# Patient Record
Sex: Female | Born: 1985 | Race: White | Hispanic: No | Marital: Single | Smoking: Current every day smoker
Health system: Southern US, Community
[De-identification: ages and names within clinical notes are randomized; demographics above are authoritative.]

---
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
## 2008-09-28 ENCOUNTER — Ambulatory Visit: Payer: Self-pay | Admitting: Internal Medicine

## 2008-09-30 ENCOUNTER — Ambulatory Visit: Payer: Self-pay | Admitting: Internal Medicine

## 2010-11-18 ENCOUNTER — Ambulatory Visit: Payer: Self-pay | Admitting: Otolaryngology

## 2016-01-17 ENCOUNTER — Encounter: Payer: Self-pay | Admitting: Obstetrics and Gynecology

## 2020-08-13 ENCOUNTER — Emergency Department
Admission: EM | Admit: 2020-08-13 | Discharge: 2020-08-13 | Disposition: A | Discharge status: Home / Self Care | Payer: No Typology Code available for payment source | Attending: Emergency Medicine | Admitting: Emergency Medicine

## 2020-08-13 ENCOUNTER — Encounter: Payer: Self-pay | Admitting: Emergency Medicine

## 2020-08-13 ENCOUNTER — Other Ambulatory Visit: Payer: Self-pay

## 2020-08-13 DIAGNOSIS — W540XXA Bitten by dog, initial encounter: Secondary | ICD-10-CM | POA: Diagnosis not present

## 2020-08-13 DIAGNOSIS — F17200 Nicotine dependence, unspecified, uncomplicated: Secondary | ICD-10-CM | POA: Insufficient documentation

## 2020-08-13 DIAGNOSIS — S61452A Open bite of left hand, initial encounter: Secondary | ICD-10-CM | POA: Diagnosis not present

## 2020-08-13 DIAGNOSIS — Z203 Contact with and (suspected) exposure to rabies: Secondary | ICD-10-CM | POA: Insufficient documentation

## 2020-08-13 DIAGNOSIS — Z23 Encounter for immunization: Secondary | ICD-10-CM | POA: Insufficient documentation

## 2020-08-13 DIAGNOSIS — Y990 Civilian activity done for income or pay: Secondary | ICD-10-CM | POA: Diagnosis not present

## 2020-08-13 DIAGNOSIS — Z2914 Encounter for prophylactic rabies immune globin: Secondary | ICD-10-CM | POA: Insufficient documentation

## 2020-08-13 DIAGNOSIS — S6992XA Unspecified injury of left wrist, hand and finger(s), initial encounter: Secondary | ICD-10-CM | POA: Diagnosis present

## 2020-08-13 MED ORDER — RABIES VACCINE, PCEC IM SUSR
1.0000 mL | Freq: Once | INTRAMUSCULAR | Status: AC
  Administered 2020-08-13: 1 mL via INTRAMUSCULAR
  Filled 2020-08-13: qty 1

## 2020-08-13 MED ORDER — RABIES IMMUNE GLOBULIN 150 UNIT/ML IM INJ
20.0000 [IU]/kg | INJECTION | Freq: Once | INTRAMUSCULAR | Status: AC
  Administered 2020-08-13: 1875 [IU] via INTRAMUSCULAR
  Filled 2020-08-13: qty 12.5

## 2020-08-13 NOTE — ED Provider Notes (Signed)
[facility] Emergency Department Provider Note  ____________________________________________  Time seen: Approximately 6:56 PM  I have reviewed the triage vital signs and the nursing notes.   HISTORY  Chief Complaint Animal Bite    HPI Rebecca Simpson is a 35 y.o. female who presents the emergency department status post dog bite to the left hand.  Patient has a very superficial bite to the left hand from an animal at work.  Patient has reported that the animal has expired rabies vaccine by 2 years.  No other complaints at this time.  Tetanus is up-to-date.         History reviewed. No pertinent past medical history.  There are no problems to display for this patient.   History reviewed. No pertinent surgical history.  Prior to Admission medications   Medication Sig Start Date End Date Taking? Authorizing Provider  busPIRone (BUSPAR) 5 MG tablet Take 5 mg by mouth 3 (three) times daily.   Yes [provider]  clonazePAM (KLONOPIN) 0.5 MG tablet Take 0.5 mg by mouth 2 (two) times daily as needed for anxiety.   Yes [provider]  lisinopril-hydrochlorothiazide (ZESTORETIC) 10-12.5 MG tablet Take 1 tablet by mouth daily.   Yes [provider]  venlafaxine XR (EFFEXOR-XR) 150 MG 24 hr capsule Take 150 mg by mouth daily with breakfast.   Yes [provider]    Allergies Patient has no known allergies.  No family history on file.  Social History Social History   Tobacco Use  . Smoking status: Current Every Day Smoker  . Smokeless tobacco: Never Used     Review of Systems  Constitutional: No fever/chills Eyes: No visual changes. No discharge ENT: No upper respiratory complaints. Cardiovascular: no chest pain. Respiratory: no cough. No SOB. Gastrointestinal: No abdominal pain.  No nausea, no vomiting.  No diarrhea.  No constipation. Musculoskeletal: Dog bite to left hand Skin: Negative for rash, abrasions,  lacerations, ecchymosis. Neurological: Negative for headaches, focal weakness or numbness.  10 System ROS otherwise negative.  ____________________________________________   PHYSICAL EXAM:  VITAL SIGNS: ED Triage Vitals  Enc Vitals Group     BP 08/13/20 1632 (!) 146/100     Pulse Rate 08/13/20 1632 (!) 122     Resp 08/13/20 1632 20     Temp 08/13/20 1632 98.1 F (36.7 C)     Temp src --      SpO2 08/13/20 1632 100 %     Weight 08/13/20 1630 205 lb (93 kg)     Height 08/13/20 1630 5\' 6"  (1.676 m)     Head Circumference --      Peak Flow --      Pain Score 08/13/20 1630 0     Pain Loc --      Pain Edu? --      Excl. in GC? --      Constitutional: Alert and oriented. Well appearing and in no acute distress. Eyes: Conjunctivae are normal. PERRL. EOMI. Head: Atraumatic. ENT:      Ears:       Nose: No congestion/rhinnorhea.      Mouth/Throat: Mucous membranes are moist.  Neck: No stridor.    Cardiovascular: Normal rate, regular rhythm. Normal S1 and S2.  Good peripheral circulation. Respiratory: Normal respiratory effort without tachypnea or retractions. Lungs CTAB. Good air entry to the bases with no decreased or absent breath sounds. Musculoskeletal: Full range of motion to all extremities. No gross deformities appreciated. Neurologic:  Normal  speech and language. No gross focal neurologic deficits are appreciated.  Skin:  Skin is warm, dry and intact. No rash noted. Psychiatric: Mood and affect are normal. Speech and behavior are normal. Patient exhibits appropriate insight and judgement.   ____________________________________________   LABS (all labs ordered are listed, but only abnormal results are displayed)  Labs Reviewed - No data to display ____________________________________________  EKG   ____________________________________________  RADIOLOGY   No results found.  ____________________________________________    PROCEDURES  Procedure(s)  performed:    Procedures    Medications  rabies vaccine (RABAVERT) injection 1 mL (1 mL Intramuscular Given 08/13/20 1831)  rabies immune globulin (HYPERAB/KEDRAB) injection 1,875 Units (1,875 Units Intramuscular Given 08/13/20 1830)     ____________________________________________   INITIAL IMPRESSION / ASSESSMENT AND PLAN / ED COURSE  Pertinent labs & imaging results that were available during my care of the patient were reviewed by me and considered in my medical decision making (see chart for details).  Review of the [place] CSRS was performed in accordance of the [facility] prior to dispensing any controlled drugs.           Patient's diagnosis is consistent with dog bite, need for rabies prophylaxis.  Patient presented to the emergency department after being bit by a dog while at work.  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  Relatively superficial injury requiring no treatment to the left hand.  Antibiotics prophylactically.  Patient will have rabies series as the animal is not up-to-date on rabies vaccine.  This is started today, return in 3 days for second rabies vaccine..  Concerning signs symptoms to return to the emergency department or follow-up sooner are discussed with the patient.  Patient is given ED precautions to return to the ED for any worsening or new symptoms.     ____________________________________________  FINAL CLINICAL IMPRESSION(S) / ED DIAGNOSES  Final diagnoses:  Dog bite, initial encounter  Need for post exposure prophylaxis for rabies      NEW MEDICATIONS STARTED DURING THIS VISIT:  ED Discharge Orders    None          This chart was dictated using voice recognition software/Dragon. Despite best efforts to proofread, errors can occur which can change the meaning. Any change was purely unintentional.    Racheal Patches, PA-C 08/13/20 Gaye Pollack, MD 08/13/20 [phone]

## 2020-08-13 NOTE — Discharge Instructions (Signed)
You may return to the emergency department in 3 days for utilize urgent care or primary care for your next rabies vaccine.  Schedule be as follows, return in 3 days, 7 days, 14 days from now for your second, third and fourth rabies vaccine.  You will not have the immunoglobin shots, so there is only 1 vaccine shot at each encounter.  If you miss your timeframe by more than 24 hours we will have to restart in the beginning.

## 2020-08-13 NOTE — ED Notes (Signed)
See triage note  Presents s/p dog bite  Has small puncture area to left hand    States the owner informed them that the dog was 2 years behind on vaccines  Pt works  at [employer]  office

## 2020-08-13 NOTE — ED Triage Notes (Addendum)
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  Patient very anxious.  States has fear of shots.  Very  Nervous about rabies injections.

## 2020-08-16 ENCOUNTER — Other Ambulatory Visit: Payer: Self-pay

## 2020-08-16 ENCOUNTER — Emergency Department
Admission: EM | Admit: 2020-08-16 | Discharge: 2020-08-16 | Disposition: A | Discharge status: Home / Self Care | Payer: No Typology Code available for payment source | Attending: Emergency Medicine | Admitting: Emergency Medicine

## 2020-08-16 ENCOUNTER — Encounter: Payer: Self-pay | Admitting: Emergency Medicine

## 2020-08-16 DIAGNOSIS — Z23 Encounter for immunization: Secondary | ICD-10-CM | POA: Insufficient documentation

## 2020-08-16 DIAGNOSIS — F17200 Nicotine dependence, unspecified, uncomplicated: Secondary | ICD-10-CM | POA: Diagnosis not present

## 2020-08-16 DIAGNOSIS — Z203 Contact with and (suspected) exposure to rabies: Secondary | ICD-10-CM | POA: Insufficient documentation

## 2020-08-16 MED ORDER — RABIES VACCINE, PCEC IM SUSR
1.0000 mL | Freq: Once | INTRAMUSCULAR | Status: AC
  Administered 2020-08-16: 1 mL via INTRAMUSCULAR
  Filled 2020-08-16: qty 1

## 2020-08-16 MED ORDER — AMOXICILLIN-POT CLAVULANATE 875-125 MG PO TABS: 1.0000 | ORAL_TABLET | Freq: Two times a day (BID) | ORAL | 0 refills | Status: AC

## 2020-08-16 NOTE — ED Triage Notes (Signed)
Presents for 2 nd rabies vaccine  denies any other complaints

## 2020-08-16 NOTE — Discharge Instructions (Addendum)
Read and follow discharge care instruction.  Take medication as directed. 

## 2020-08-16 NOTE — ED Provider Notes (Signed)
[facility] Emergency Department Provider Note   ____________________________________________   Event Date/Time   First MD Initiated Contact with Patient 08/16/20 0935     (approximate)  I have reviewed the triage vital signs and the nursing notes.   HISTORY  Chief Complaint Rabies Injection    HPI Rebecca Simpson is a 35 y.o. female patient presents for second in series of rabies injections secondary to dog bite right hand.  Patient states no adverse reaction to first series of injections.  Patient states she did not receive her prescription for antibiotics.         History reviewed. No pertinent past medical history.  There are no problems to display for this patient.   History reviewed. No pertinent surgical history.  Prior to Admission medications   Medication Sig Start Date End Date Taking? Authorizing Provider  amoxicillin-clavulanate (AUGMENTIN) 875-125 MG tablet Take 1 tablet by mouth 2 (two) times daily for 10 days. 08/16/20 08/26/20 Yes Joni Reining, PA-C  busPIRone (BUSPAR) 5 MG tablet Take 5 mg by mouth 3 (three) times daily.    [provider]  clonazePAM (KLONOPIN) 0.5 MG tablet Take 0.5 mg by mouth 2 (two) times daily as needed for anxiety.    [provider]  lisinopril-hydrochlorothiazide (ZESTORETIC) 10-12.5 MG tablet Take 1 tablet by mouth daily.    [provider]  venlafaxine XR (EFFEXOR-XR) 150 MG 24 hr capsule Take 150 mg by mouth daily with breakfast.    [provider]    Allergies Patient has no known allergies.  No family history on file.  Social History Social History   Tobacco Use   Smoking status: Every Day    Pack years: 0.00   Smokeless tobacco: Never    Review of Systems  Constitutional: No fever/chills Eyes: No visual changes. ENT: No sore throat. Cardiovascular: Denies chest pain. Respiratory: Denies shortness of breath. Gastrointestinal: No abdominal pain.   No nausea, no vomiting.  No diarrhea.  No constipation. Genitourinary: Negative for dysuria. Musculoskeletal: Negative for back pain. Skin: Negative for rash.  Dog bite right hand Neurological: Negative for headaches, focal weakness or numbness. Psychiatric: Anxiety Endocrine: Hypertension  ____________________________________________   PHYSICAL EXAM:  VITAL SIGNS: ED Triage Vitals  Enc Vitals Group     BP 08/16/20 0937 (!) 147/88     Pulse Rate 08/16/20 0937 88     Resp 08/16/20 0937 18     Temp 08/16/20 0937 98 F (36.7 C)     Temp Source 08/16/20 0937 Oral     SpO2 08/16/20 0937 99 %     Weight 08/16/20 0934 205 lb 0.4 oz (93 kg)     Height 08/16/20 0934 5\' 6"  (1.676 m)     Head Circumference --      Peak Flow --      Pain Score 08/16/20 0934 0     Pain Loc --      Pain Edu? --      Excl. in GC? --     Constitutional: Alert and oriented. Well appearing and in no acute distress. Cardiovascular: Normal rate, regular rhythm. Grossly normal heart sounds.  Good peripheral circulation. Respiratory: Normal respiratory effort.  No retractions. Lungs CTAB. Neurologic:  Normal speech and language. No gross focal neurologic deficits are appreciated. No gait instability. Skin:  Skin is warm, dry and intact. No rash noted. Psychiatric: Mood and affect are normal. Speech and behavior are normal.  ____________________________________________   LABS (all  labs ordered are listed, but only abnormal results are displayed)  Labs Reviewed - No data to display ____________________________________________  EKG   ____________________________________________  RADIOLOGY I, Joni Reining, personally viewed and evaluated these images (plain radiographs) as part of my medical decision making, as well as reviewing the written report by the radiologist.  ED MD interpretation:    Official radiology report(s): No results  found.  ____________________________________________   PROCEDURES  Procedure(s) performed (including Critical Care):  Procedures   ____________________________________________   INITIAL IMPRESSION / ASSESSMENT AND PLAN / ED COURSE  As part of my medical decision making, I reviewed the following data within the electronic MEDICAL RECORD NUMBER         Patient presents for the second rabies shot secondary to suspected exposure for dog bite.  Patient given discharge care instruction advised to show up on day 7 for next injection.      ____________________________________________   FINAL CLINICAL IMPRESSION(S) / ED DIAGNOSES  Final diagnoses:  Contact with and suspected exposure to rabies     ED Discharge Orders          Ordered    amoxicillin-clavulanate (AUGMENTIN) 875-125 MG tablet  2 times daily        08/16/20 0942             Note:  This document was prepared using Dragon voice recognition software and may include unintentional dictation errors.    Joni Reining, PA-C 08/16/20 3435    Gilles Chiquito, MD 08/18/20 [phone]

## 2020-08-23 ENCOUNTER — Other Ambulatory Visit: Payer: Self-pay

## 2020-08-23 ENCOUNTER — Emergency Department
Admission: EM | Admit: 2020-08-23 | Discharge: 2020-08-23 | Disposition: A | Discharge status: Home / Self Care | Payer: No Typology Code available for payment source | Attending: Emergency Medicine | Admitting: Emergency Medicine

## 2020-08-23 ENCOUNTER — Encounter: Payer: Self-pay | Admitting: Emergency Medicine

## 2020-08-23 DIAGNOSIS — F17200 Nicotine dependence, unspecified, uncomplicated: Secondary | ICD-10-CM | POA: Diagnosis not present

## 2020-08-23 DIAGNOSIS — Z23 Encounter for immunization: Secondary | ICD-10-CM | POA: Diagnosis not present

## 2020-08-23 MED ORDER — RABIES VACCINE, PCEC IM SUSR
1.0000 mL | Freq: Once | INTRAMUSCULAR | Status: AC
  Administered 2020-08-23: 1 mL via INTRAMUSCULAR
  Filled 2020-08-23: qty 1

## 2020-08-23 NOTE — ED Notes (Signed)
Pt presents for 3rd rabies vaccine. Pt sts she has felt feverish and had diarrhea after each vaccine. Denies other symptoms. Gait steady. A&Ox4.

## 2020-08-23 NOTE — ED Triage Notes (Signed)
Presents for 3 rd rabies shot  denies any new complaints but states she felt feverish after the last shot

## 2020-08-23 NOTE — ED Notes (Signed)
ED Provider Wyvonne Lenz at bedside.

## 2020-08-26 NOTE — ED Provider Notes (Signed)
[facility]-EMERGENCY DEPARTMENT  ____________________________________________  Time seen: Approximately 10:39 PM  I have reviewed the triage vital signs and the nursing notes.   HISTORY  Chief Complaint Rabies Injection   Historian Patient    HPI Rebecca Simpson is a 35 y.o. female presents to the emergency department for rabies vaccination.  Patient has had low-grade fever and diarrhea after each vaccine.  She has no questions.   History reviewed. No pertinent past medical history.   Immunizations up to date:  Yes.     History reviewed. No pertinent past medical history.  There are no problems to display for this patient.   History reviewed. No pertinent surgical history.  Prior to Admission medications   Medication Sig Start Date End Date Taking? Authorizing Provider  amoxicillin-clavulanate (AUGMENTIN) 875-125 MG tablet Take 1 tablet by mouth 2 (two) times daily for 10 days. 08/16/20 08/26/20  Joni Reining, PA-C  busPIRone (BUSPAR) 5 MG tablet Take 5 mg by mouth 3 (three) times daily.    [provider]  clonazePAM (KLONOPIN) 0.5 MG tablet Take 0.5 mg by mouth 2 (two) times daily as needed for anxiety.    [provider]  lisinopril-hydrochlorothiazide (ZESTORETIC) 10-12.5 MG tablet Take 1 tablet by mouth daily.    [provider]  venlafaxine XR (EFFEXOR-XR) 150 MG 24 hr capsule Take 150 mg by mouth daily with breakfast.    [provider]    Allergies Patient has no known allergies.  No family history on file.  Social History Social History   Tobacco Use   Smoking status: Every Day    Pack years: 0.00   Smokeless tobacco: Never     Review of Systems  Constitutional: No fever/chills Eyes:  No discharge ENT: No upper respiratory complaints. Respiratory: no cough. No SOB/ use of accessory muscles to breath Gastrointestinal:   No nausea, no vomiting.  No diarrhea.  No constipation. Musculoskeletal: Negative for  musculoskeletal pain. Skin: Negative for rash, abrasions, lacerations, ecchymosis.    ____________________________________________   PHYSICAL EXAM:  VITAL SIGNS: ED Triage Vitals  Enc Vitals Group     BP 08/23/20 1500 110/74     Pulse Rate 08/23/20 1500 90     Resp 08/23/20 1500 18     Temp 08/23/20 1515 98.1 F (36.7 C)     Temp Source 08/23/20 1708 Oral     SpO2 08/23/20 1500 100 %     Weight 08/23/20 1450 205 lb 0.4 oz (93 kg)     Height 08/23/20 1450 5\' 6"  (1.676 m)     Head Circumference --      Peak Flow --      Pain Score 08/23/20 1450 0     Pain Loc --      Pain Edu? --      Excl. in GC? --      Constitutional: Alert and oriented. Well appearing and in no acute distress. Eyes: Conjunctivae are normal. PERRL. EOMI. Head: Atraumatic. ENT:      Ears:       Nose: No congestion/rhinnorhea.      Mouth/Throat: Mucous membranes are moist.  Neck: No stridor.  No cervical spine tenderness to palpation. Cardiovascular: Normal rate, regular rhythm. Normal S1 and S2.  Good peripheral circulation. Respiratory: Normal respiratory effort without tachypnea or retractions. Lungs CTAB. Good air entry to the bases with no decreased or absent breath sounds Gastrointestinal: Bowel sounds x 4 quadrants. Soft and nontender to palpation. No guarding or rigidity. No  distention. Musculoskeletal: Full range of motion to all extremities. No obvious deformities noted Neurologic:  Normal for age. No gross focal neurologic deficits are appreciated.  Skin:  Skin is warm, dry and intact. No rash noted. Psychiatric: Mood and affect are normal for age. Speech and behavior are normal.   ____________________________________________   LABS (all labs ordered are listed, but only abnormal results are displayed)  Labs Reviewed - No data to display ____________________________________________  EKG   ____________________________________________  RADIOLOGY   No results  found.  ____________________________________________    PROCEDURES  Procedure(s) performed:     Procedures     Medications  rabies vaccine (RABAVERT) injection 1 mL (1 mL Intramuscular Given 08/23/20 1516)     ____________________________________________   INITIAL IMPRESSION / ASSESSMENT AND PLAN / ED COURSE  Pertinent labs & imaging results that were available during my care of the patient were reviewed by me and considered in my medical decision making (see chart for details).      Assessment and plan Routine encounter for rabies vaccination 35 year old female presents to the emergency department for second rabies vaccine from time of injury.  Recommended returning to the emergency department 14 days from initiation of series for final rabies shot.  All patient questions were answered.     ____________________________________________  FINAL CLINICAL IMPRESSION(S) / ED DIAGNOSES  Final diagnoses:  Need for immunization against rabies      NEW MEDICATIONS STARTED DURING THIS VISIT:  ED Discharge Orders     None           This chart was dictated using voice recognition software/Dragon. Despite best efforts to proofread, errors can occur which can change the meaning. Any change was purely unintentional.     Gasper Lloyd 08/26/20 2242    Chesley Noon, MD 08/26/20 [phone]

## 2021-01-29 ENCOUNTER — Other Ambulatory Visit: Payer: Self-pay | Admitting: Family Medicine

## 2021-01-29 ENCOUNTER — Ambulatory Visit
Admission: RE | Admit: 2021-01-29 | Discharge: 2021-01-29 | Disposition: A | Discharge status: Home / Self Care | Payer: BC Managed Care – PPO | Source: Ambulatory Visit | Attending: Family Medicine | Admitting: Family Medicine

## 2021-01-29 ENCOUNTER — Other Ambulatory Visit: Payer: Self-pay

## 2021-01-29 DIAGNOSIS — G44319 Acute post-traumatic headache, not intractable: Secondary | ICD-10-CM | POA: Diagnosis not present

## 2021-01-29 DIAGNOSIS — W108XXA Fall (on) (from) other stairs and steps, initial encounter: Secondary | ICD-10-CM

## 2021-05-10 ENCOUNTER — Emergency Department: Payer: BC Managed Care – PPO

## 2021-05-10 ENCOUNTER — Emergency Department
Admission: EM | Admit: 2021-05-10 | Discharge: 2021-05-11 | Disposition: A | Discharge status: Home / Self Care | Payer: BC Managed Care – PPO | Attending: Emergency Medicine | Admitting: Emergency Medicine

## 2021-05-10 ENCOUNTER — Other Ambulatory Visit: Payer: Self-pay

## 2021-05-10 DIAGNOSIS — Y92410 Unspecified street and highway as the place of occurrence of the external cause: Secondary | ICD-10-CM | POA: Insufficient documentation

## 2021-05-10 DIAGNOSIS — E278 Other specified disorders of adrenal gland: Secondary | ICD-10-CM | POA: Insufficient documentation

## 2021-05-10 DIAGNOSIS — E876 Hypokalemia: Secondary | ICD-10-CM | POA: Diagnosis not present

## 2021-05-10 DIAGNOSIS — Y906 Blood alcohol level of 120-199 mg/100 ml: Secondary | ICD-10-CM | POA: Diagnosis not present

## 2021-05-10 DIAGNOSIS — J811 Chronic pulmonary edema: Secondary | ICD-10-CM | POA: Diagnosis not present

## 2021-05-10 DIAGNOSIS — F1010 Alcohol abuse, uncomplicated: Secondary | ICD-10-CM | POA: Insufficient documentation

## 2021-05-10 DIAGNOSIS — R109 Unspecified abdominal pain: Secondary | ICD-10-CM | POA: Diagnosis not present

## 2021-05-10 DIAGNOSIS — R079 Chest pain, unspecified: Secondary | ICD-10-CM | POA: Diagnosis not present

## 2021-05-10 DIAGNOSIS — S27329A Contusion of lung, unspecified, initial encounter: Secondary | ICD-10-CM

## 2021-05-10 DIAGNOSIS — S27322A Contusion of lung, bilateral, initial encounter: Secondary | ICD-10-CM

## 2021-05-10 DIAGNOSIS — R519 Headache, unspecified: Secondary | ICD-10-CM | POA: Diagnosis not present

## 2021-05-10 DIAGNOSIS — F1990 Other psychoactive substance use, unspecified, uncomplicated: Secondary | ICD-10-CM

## 2021-05-10 LAB — URINE DRUG SCREEN, QUALITATIVE (ARMC ONLY)
Amphetamines, Ur Screen: POSITIVE — AB
Barbiturates, Ur Screen: NOT DETECTED
Benzodiazepine, Ur Scrn: POSITIVE — AB
Cannabinoid 50 Ng, Ur ~~LOC~~: POSITIVE — AB
Cocaine Metabolite,Ur ~~LOC~~: NOT DETECTED
MDMA (Ecstasy)Ur Screen: NOT DETECTED
Methadone Scn, Ur: NOT DETECTED
Opiate, Ur Screen: NOT DETECTED
Phencyclidine (PCP) Ur S: NOT DETECTED
Tricyclic, Ur Screen: NOT DETECTED

## 2021-05-10 LAB — BASIC METABOLIC PANEL
Anion gap: 12 (ref 5–15)
BUN: 11 mg/dL (ref 6–20)
CO2: 27 mmol/L (ref 22–32)
Calcium: 9 mg/dL (ref 8.9–10.3)
Chloride: 95 mmol/L — ABNORMAL LOW (ref 98–111)
Creatinine, Ser: 0.68 mg/dL (ref 0.44–1.00)
GFR, Estimated: >60 mL/min (ref >60)
Glucose, Bld: 164 mg/dL — ABNORMAL HIGH (ref 70–99)
Potassium: 2.8 mmol/L — ABNORMAL LOW (ref 3.5–5.1)
Sodium: 134 mmol/L — ABNORMAL LOW (ref 135–145)

## 2021-05-10 LAB — CBC WITH DIFFERENTIAL/PLATELET
Abs Immature Granulocytes: 0.05 10*3/uL (ref 0.00–0.07)
Basophils Absolute: 0.1 10*3/uL (ref 0.0–0.1)
Basophils Relative: 1 %
Eosinophils Absolute: 0.2 10*3/uL (ref 0.0–0.5)
Eosinophils Relative: 2 %
HCT: 38.7 % (ref 36.0–46.0)
Hemoglobin: 13.9 g/dL (ref 12.0–15.0)
Immature Granulocytes: 1 %
Lymphocytes Relative: 36 %
Lymphs Abs: 3.4 10*3/uL (ref 0.7–4.0)
MCH: 33 pg (ref 26.0–34.0)
MCHC: 35.9 g/dL (ref 30.0–36.0)
MCV: 91.9 fL (ref 80.0–100.0)
Monocytes Absolute: 0.5 10*3/uL (ref 0.1–1.0)
Monocytes Relative: 5 %
Neutro Abs: 5.4 10*3/uL (ref 1.7–7.7)
Neutrophils Relative %: 55 %
Platelets: 273 10*3/uL (ref 150–400)
RBC: 4.21 MIL/uL (ref 3.87–5.11)
RDW: 12.4 % (ref 11.5–15.5)
WBC: 9.7 10*3/uL (ref 4.0–10.5)
nRBC: 0 % (ref 0.0–0.2)

## 2021-05-10 LAB — POC URINE PREG, ED: Preg Test, Ur: NEGATIVE

## 2021-05-10 LAB — ETHANOL: Alcohol, Ethyl (B): 175 mg/dL — ABNORMAL HIGH (ref <10)

## 2021-05-10 MED ORDER — POTASSIUM CHLORIDE 10 MEQ/100ML IV SOLN
10.0000 meq | INTRAVENOUS | Status: AC
  Administered 2021-05-10 – 2021-05-11 (×4): 10 meq via INTRAVENOUS
  Filled 2021-05-10 (×4): qty 100

## 2021-05-10 MED ORDER — IOHEXOL 300 MG/ML  SOLN
100.0000 mL | Freq: Once | INTRAMUSCULAR | Status: AC | PRN
  Administered 2021-05-10: 100 mL via INTRAVENOUS

## 2021-05-10 MED ORDER — SODIUM CHLORIDE 0.9 % IV BOLUS
1000.0000 mL | Freq: Once | INTRAVENOUS | Status: AC
  Administered 2021-05-10: 1000 mL via INTRAVENOUS
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
## 2021-05-10 NOTE — Discharge Instructions (Addendum)
Your CT of your Chest, Abdomen, and Pelvis showed: ?IMPRESSION: ?CT of the chest: Mild edema is noted in the lungs bilaterally likely ?related to the recent trauma. No effusion or pneumothorax is noted. ?  ?CT of the abdomen and pelvis: Fatty liver. ?  ?2.0 cm hypodense mass in the left adrenal gland likely representing ?an adenoma. Adrenal CT is recommended in 1 year for follow-up ?examination. ?  ?No other focal abnormality is noted. ? ?Please talk to your doctor about obtaining an adrenal CT scan in 1 year to follow up on the left adrenal gland lesion seen today.  Return to the ER for worsening symptoms, persistent vomiting, difficulty breathing or other concerns. ?
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
## 2021-05-10 NOTE — ED Notes (Signed)
Patient transported to CT 
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
## 2021-05-11 NOTE — ED Provider Notes (Signed)
----------------------------------------- ?  00:30 AM on 05/11/2021 ?-----------------------------------------  ? ?Care assumed from Dr. Derrill Kay who reports when awake and on room air with good inspiration patient's sats are 93-95%.  Plan to observe in the ED overnight receiving IV fluids and potassium. ? ? ?----------------------------------------- ?5:35 AM on 05/11/2021 ?-----------------------------------------  ? ?Patient awakening, was able to ambulate with steady gait to the restroom and back to her bed.  Currently sleeping but awakens to voice.  Voices no complaints at this time.  Room air saturation 94%.  Patient has no one to call this early in the morning so will continue to rest and be observed in the ED.  Anticipate discharge home later this morning. ? ? ?----------------------------------------- ?7:00 AM on 05/11/2021 ?-----------------------------------------  ? ?Patient sleeping in no acute distress.  Care transferred to Dr. Katrinka Blazing at change of shift.  Anticipate discharge home later this morning barring hypoxia with patient fully awake. ?  ?Irean Hong, MD ?05/11/21 0701 ? ?
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
## 2023-06-24 ENCOUNTER — Other Ambulatory Visit: Payer: Self-pay | Admitting: Family Medicine

## 2023-06-24 DIAGNOSIS — E278 Other specified disorders of adrenal gland: Secondary | ICD-10-CM

## 2023-06-28 ENCOUNTER — Ambulatory Visit
Admission: RE | Admit: 2023-06-28 | Discharge: 2023-06-28 | Disposition: A | Discharge status: Home / Self Care | Payer: Self-pay | Source: Ambulatory Visit | Attending: Family Medicine | Admitting: Family Medicine

## 2023-06-28 DIAGNOSIS — E278 Other specified disorders of adrenal gland: Secondary | ICD-10-CM

## 2023-06-28 MED ORDER — IOPAMIDOL (ISOVUE-300) INJECTION 61%
100.0000 mL | Freq: Once | INTRAVENOUS | Status: AC | PRN
  Administered 2023-06-28: 100 mL via INTRAVENOUS

## 2023-12-07 ENCOUNTER — Emergency Department: Payer: Self-pay

## 2023-12-07 ENCOUNTER — Emergency Department
Admission: EM | Admit: 2023-12-07 | Discharge: 2023-12-07 | Disposition: A | Discharge status: Home / Self Care | Payer: Self-pay

## 2023-12-07 ENCOUNTER — Other Ambulatory Visit: Payer: Self-pay

## 2023-12-07 ENCOUNTER — Encounter: Payer: Self-pay | Admitting: Emergency Medicine

## 2023-12-07 DIAGNOSIS — J209 Acute bronchitis, unspecified: Secondary | ICD-10-CM | POA: Insufficient documentation

## 2023-12-07 LAB — RESP PANEL BY RT-PCR (RSV, FLU A&B, COVID)  RVPGX2
Influenza A by PCR: NEGATIVE
Influenza B by PCR: NEGATIVE
Resp Syncytial Virus by PCR: NEGATIVE
SARS Coronavirus 2 by RT PCR: NEGATIVE

## 2023-12-07 MED ORDER — HYDROCODONE BIT-HOMATROP MBR 5-1.5 MG/5ML PO SOLN: 5.0000 mL | Freq: Four times a day (QID) | ORAL | 0 refills | Status: AC | PRN

## 2023-12-07 MED ORDER — PREDNISONE 10 MG (21) PO TBPK: ORAL_TABLET | ORAL | 0 refills | Status: AC

## 2023-12-07 MED ORDER — AMOXICILLIN-POT CLAVULANATE 875-125 MG PO TABS: 1.0000 | ORAL_TABLET | Freq: Two times a day (BID) | ORAL | 0 refills | Status: AC

## 2023-12-07 NOTE — ED Triage Notes (Signed)
 Patient to ED via POV for productive cough- green with wheezing, congestion and cogged ears. States she just finished prednisone for same and has been taking dayquil. Last tested neg for covid 1 week ago. NAD noted. Ongoing x2 weeks.

## 2023-12-07 NOTE — ED Provider Notes (Signed)
 [facility] Provider Note    Event Date/Time   First MD Initiated Contact with Patient 12/07/23 1249     (approximate)   History   Cough   HPI  Rebecca Simpson is a 38 y.o. female with no significant past medical history presents emergency department with cough, congestion, green mucus, wheezing, chest discomfort for 3 weeks.  Patient states was seen by her regular doctor, given prednisone 20 mg a day for 5 days and told to use over-the-counter cough medication which is not helping.  Patient states she feels like she is getting worse.  She does have an inhaler at home.  States she is a non-smoker.  No vomiting or diarrhea.  COVID test have been negative at home      Physical Exam   Triage Vital Signs: ED Triage Vitals [12/07/23 1209]  Encounter Vitals Group     BP (!) 149/105     Girls Systolic BP Percentile      Girls Diastolic BP Percentile      Boys Systolic BP Percentile      Boys Diastolic BP Percentile      Pulse Rate 96     Resp 17     Temp 98.4 F (36.9 C)     Temp Source Oral     SpO2 100 %     Weight 190 lb (86.2 kg)     Height 5' 8 (1.727 m)     Head Circumference      Peak Flow      Pain Score 8     Pain Loc      Pain Education      Exclude from Growth Chart     Most recent vital signs: Vitals:   12/07/23 1209  BP: (!) 149/105  Pulse: 96  Resp: 17  Temp: 98.4 F (36.9 C)  SpO2: 100%     General: Awake, no distress.   CV:  Good peripheral perfusion. regular rate and  rhythm Resp:  Normal effort. Lungs with wheezing bilaterally Abd:  No distention.   Other:     ED Results / Procedures / Treatments   Labs (all labs ordered are listed, but only abnormal results are displayed) Labs Reviewed  RESP PANEL BY RT-PCR (RSV, FLU A&B, COVID)  [id number]     EKG     RADIOLOGY Chest x-ray    PROCEDURES:   Procedures  Critical Care:  no Chief Complaint  Patient presents with   Cough      MEDICATIONS  ORDERED IN ED: Medications - No data to display   IMPRESSION / MDM / ASSESSMENT AND PLAN / ED COURSE  I reviewed the triage vital signs and the nursing notes.                              Differential diagnosis includes, but is not limited to, acute bronchitis, CAP, COVID, influenza, RSV  Patient's presentation is most consistent with acute illness / injury with system symptoms.   Chest x-ray independently reviewed interpreted by me as being negative for any acute abnormality, however I do see some increased bronchial markings indicating bronchitis  Due to the patient's cough and wheezing we will start her on Sterapred.  I do not feel that she was on a high enough dose previously.  Will give her prescription for Augmentin .  Hycodan cough syrup for at night.  Did send the prescriptions of Hycodan  and Sterapred to [facility] as it was less expensive on her GoodRx, while [facility] was cheaper for Augmentin .  This was sent to patient's request.  She is to follow-up with her regular doctor.  Return emergency department worsening.  She is discharged stable condition.      FINAL CLINICAL IMPRESSION(S) / ED DIAGNOSES   Final diagnoses:  Acute bronchitis, unspecified organism     Rx / DC Orders   ED Discharge Orders          Ordered    HYDROcodone bit-homatropine (HYCODAN) 5-1.5 MG/5ML syrup  Every 6 hours PRN        12/07/23 1316    predniSONE (STERAPRED UNI-PAK 21 TAB) 10 MG (21) TBPK tablet        12/07/23 1316    amoxicillin -clavulanate (AUGMENTIN ) 875-125 MG tablet  2 times daily        12/07/23 1317             Note:  This document was prepared using Dragon voice recognition software and may include unintentional dictation errors.    Gasper Devere ORN, PA-C 12/07/23 1322    Clarine Ozell LABOR, MD 12/07/23 [phone]
